# Patient Record
Sex: Female | Born: 1970 | Race: Asian | Hispanic: No | Marital: Married
Health system: Southern US, Community
[De-identification: ages and names within clinical notes are randomized; demographics above are authoritative.]

---

## 2003-05-02 ENCOUNTER — Other Ambulatory Visit: Admission: RE | Admit: 2003-05-02 | Discharge: 2003-05-02 | Payer: Self-pay | Admitting: Family Medicine

## 2003-10-28 ENCOUNTER — Other Ambulatory Visit: Admission: RE | Admit: 2003-10-28 | Discharge: 2003-10-28 | Payer: Self-pay | Admitting: Obstetrics and Gynecology

## 2004-04-12 ENCOUNTER — Inpatient Hospital Stay (HOSPITAL_COMMUNITY): Admission: AD | Admit: 2004-04-12 | Discharge: 2004-04-14 | Payer: Self-pay | Admitting: Obstetrics and Gynecology

## 2005-05-14 ENCOUNTER — Other Ambulatory Visit: Admission: RE | Admit: 2005-05-14 | Discharge: 2005-05-14 | Payer: Self-pay | Admitting: Obstetrics and Gynecology

## 2005-12-28 ENCOUNTER — Inpatient Hospital Stay (HOSPITAL_COMMUNITY): Admission: AD | Admit: 2005-12-28 | Discharge: 2005-12-28 | Payer: Self-pay | Admitting: Obstetrics and Gynecology

## 2007-06-13 IMAGING — US US OB TRANSVAGINAL MODIFY
1 series · 14 of 28 positions shown · non-contrast
Comparison: None.

CLINICAL DATA: Vaginal bleeding at 10 weeks and 0 days of pregnancy by last
menstrual period. No quantitative beta-hCG is available at this time.

COMPLETE OBSTETRICAL ULTRASOUND LESS THAN 14 WEEKS AND TRANSVAGINAL OBSTETRICAL
ULTRASOUND

[Series 1: us ob transvaginal modify · 0.20mm/px · 14 of 51 slices shown]
[im 2/51]
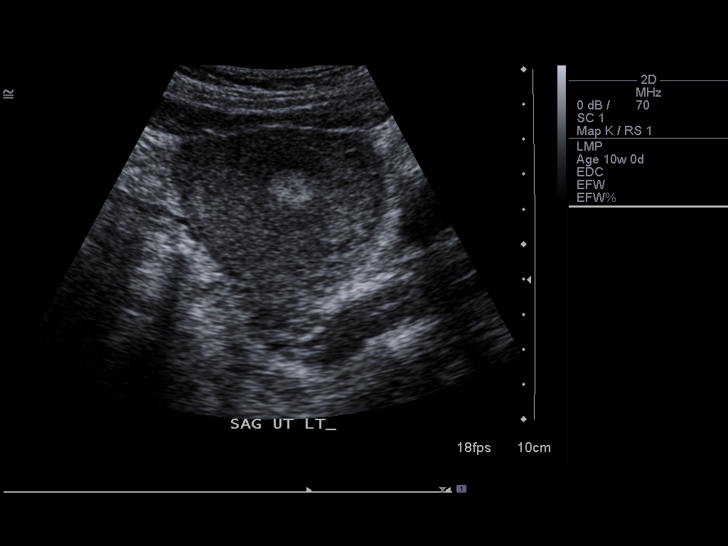
[im 6/51]
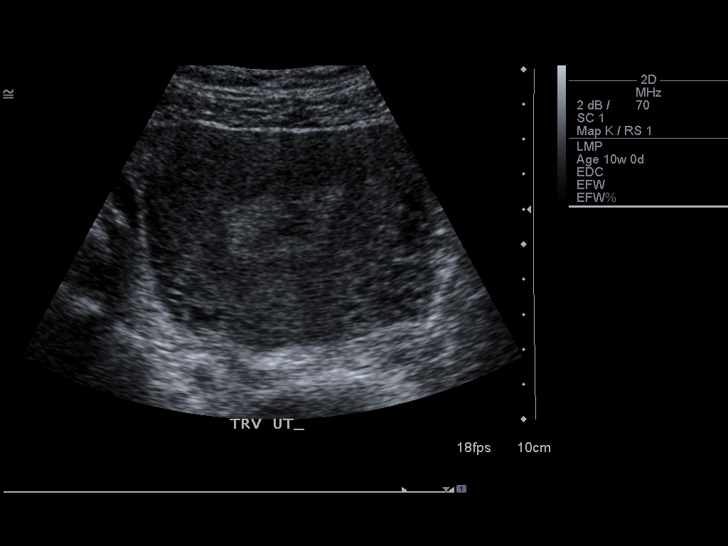
[im 10/51]
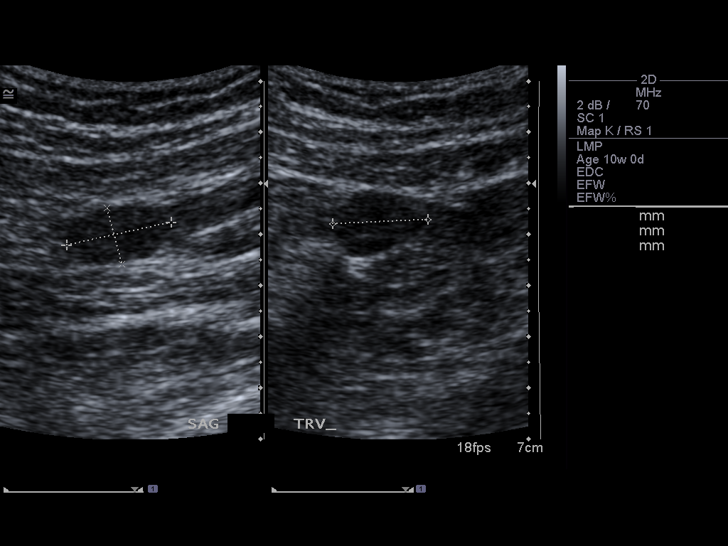
[im 13/51]
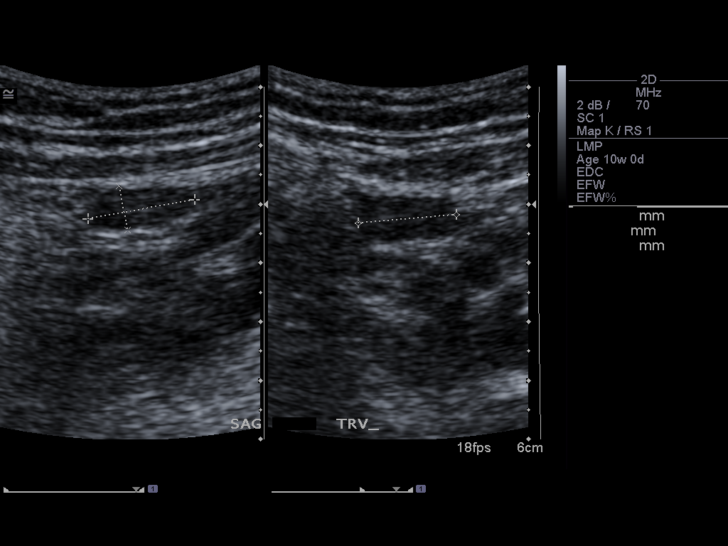
[im 17/51]
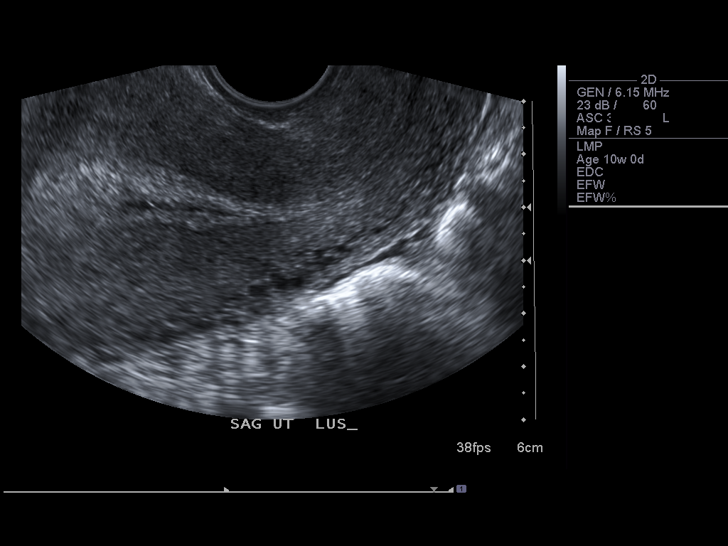
[im 21/51]
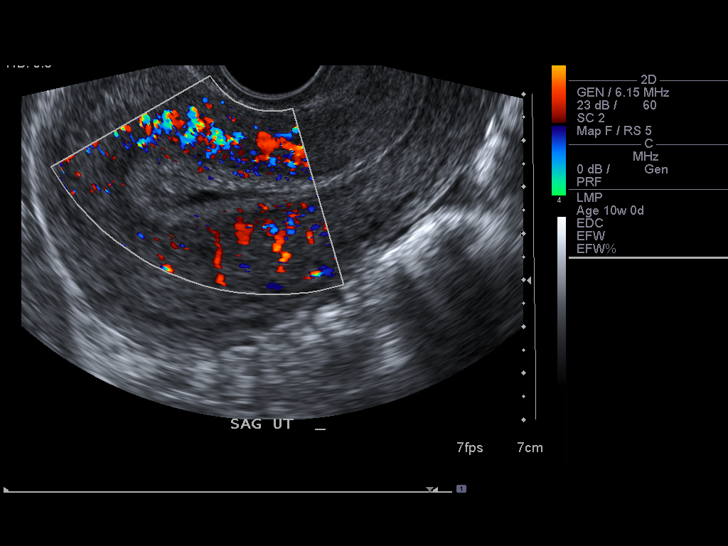
[im 25/51]
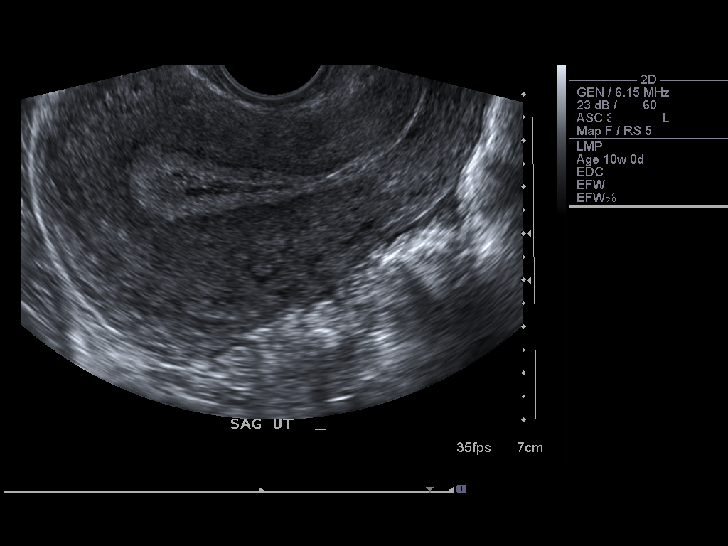
[im 28/51]
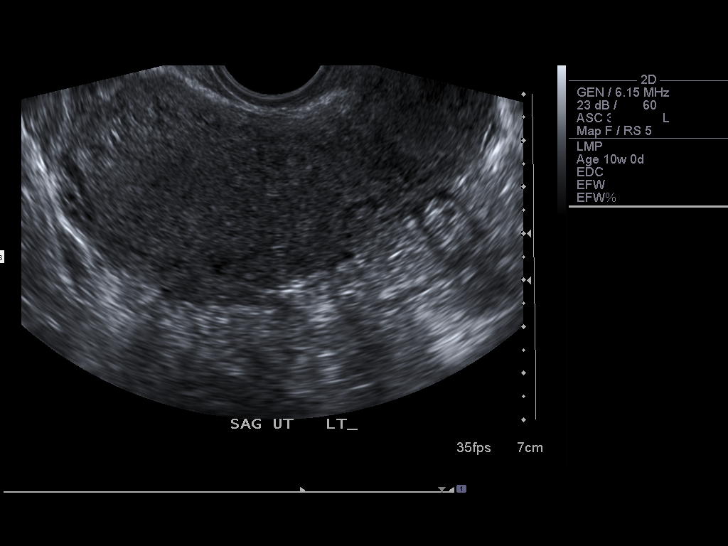
[im 32/51]
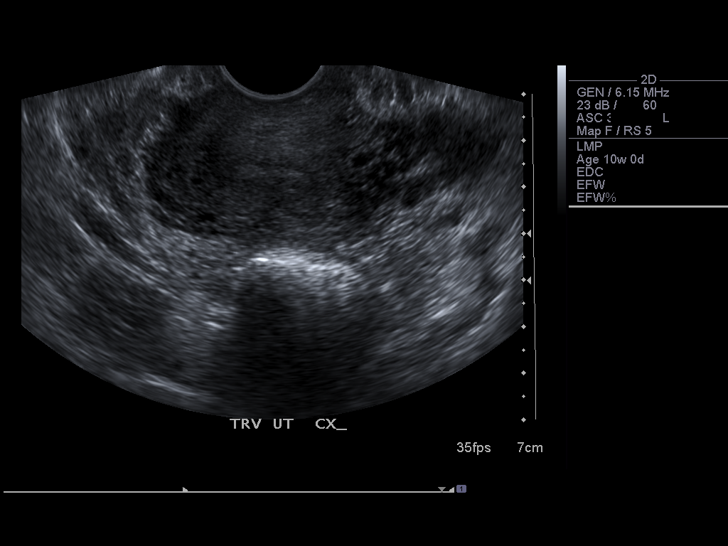
[im 36/51]
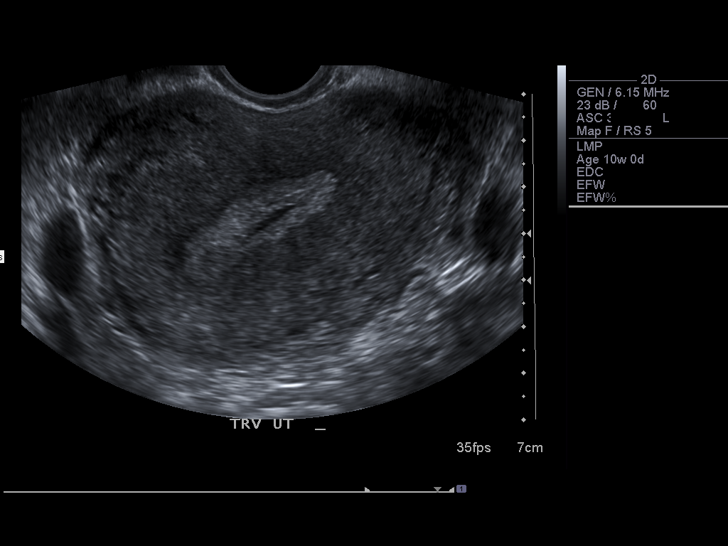
[im 39/51]
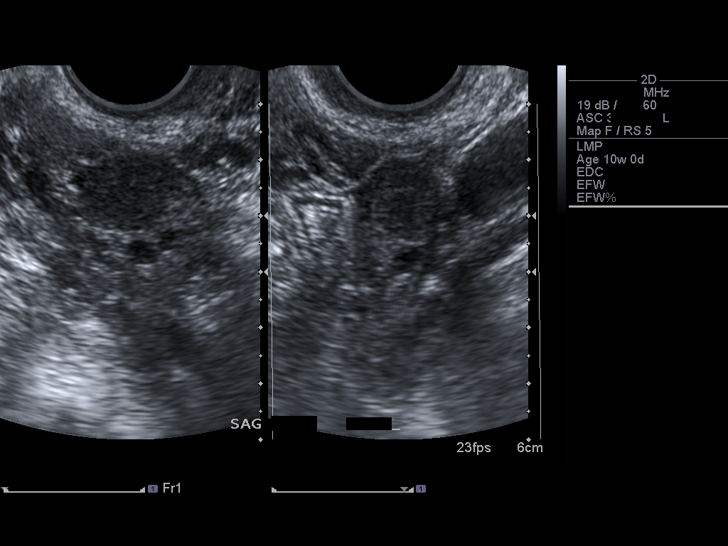
[im 43/51]
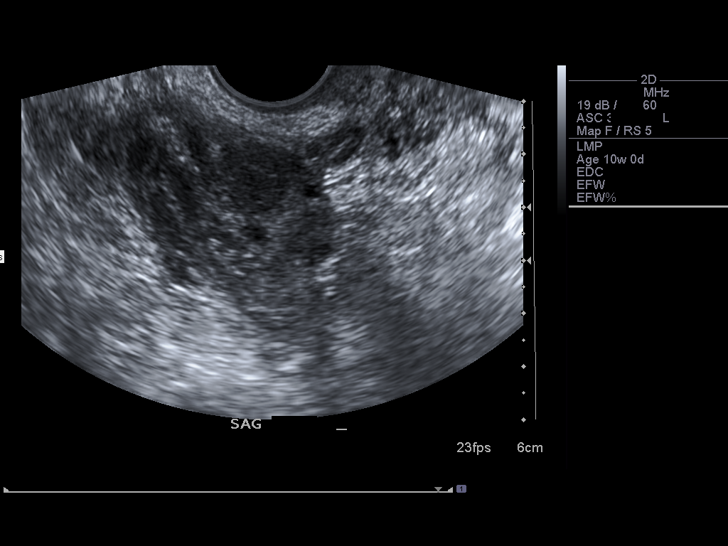
[im 47/51]
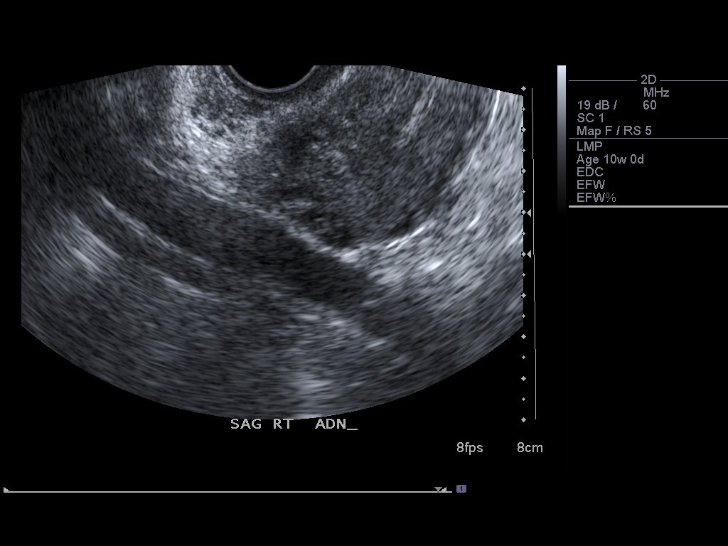
[im 51/51]
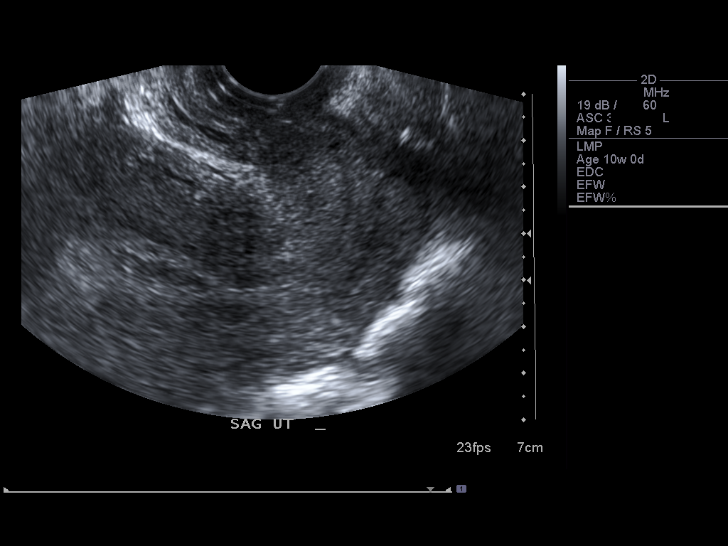

[14 of 28 positions shown; findings below may reference images not displayed]

FINDINGS: Transabdominal and transvaginal sonographic imaging of the pelvis
demonstrates a thickened, heterogeneous endometrial stripe with centrally
decreased echogenicity. This measures 19.7 mm in maximum thickness,
transvaginally. No intrauterine or extrauterine gestational sac seen. 1.5 cm
resolving corpus luteum in the left ovary. Normal appearing right ovary. Trace
amount of free peritoneal fluid, within normal limits physiological fluid.

IMPRESSION

Thickened endometrium with central blood or clot. No gestational sac seen. This
most likely represents spontaneous abortion. An ectopic pregnancy cannot be
excluded. Correlation with serial quantitative beta-hCG is necessary.

## 2018-07-04 ENCOUNTER — Emergency Department (HOSPITAL_COMMUNITY)
Admission: EM | Admit: 2018-07-04 | Discharge: 2018-07-04 | Disposition: A | Discharge status: Home / Self Care | Payer: BLUE CROSS/BLUE SHIELD | Attending: Emergency Medicine | Admitting: Emergency Medicine

## 2018-07-04 ENCOUNTER — Other Ambulatory Visit: Payer: Self-pay

## 2018-07-04 ENCOUNTER — Emergency Department (HOSPITAL_COMMUNITY): Payer: BLUE CROSS/BLUE SHIELD

## 2018-07-04 ENCOUNTER — Encounter (HOSPITAL_COMMUNITY): Payer: Self-pay | Admitting: Emergency Medicine

## 2018-07-04 DIAGNOSIS — R05 Cough: Secondary | ICD-10-CM | POA: Diagnosis present

## 2018-07-04 DIAGNOSIS — U071 COVID-19: Secondary | ICD-10-CM | POA: Diagnosis not present

## 2018-07-04 LAB — BASIC METABOLIC PANEL
Anion gap: 10 (ref 5–15)
BUN: 7 mg/dL (ref 6–20)
CO2: 25 mmol/L (ref 22–32)
Calcium: 8.9 mg/dL (ref 8.9–10.3)
Chloride: 102 mmol/L (ref 98–111)
Creatinine, Ser: 0.68 mg/dL (ref 0.44–1.00)
GFR calc Af Amer: >60 mL/min (ref >60)
GFR calc non Af Amer: >60 mL/min (ref >60)
Glucose, Bld: 109 mg/dL — ABNORMAL HIGH (ref 70–99)
Potassium: 3.1 mmol/L — ABNORMAL LOW (ref 3.5–5.1)
Sodium: 137 mmol/L (ref 135–145)

## 2018-07-04 LAB — CBC
HCT: 38.2 % (ref 36.0–46.0)
Hemoglobin: 12.1 g/dL (ref 12.0–15.0)
MCH: 25.5 pg — ABNORMAL LOW (ref 26.0–34.0)
MCHC: 31.7 g/dL (ref 30.0–36.0)
MCV: 80.4 fL (ref 80.0–100.0)
Platelets: 179 10*3/uL (ref 150–400)
RBC: 4.75 MIL/uL (ref 3.87–5.11)
RDW: 13.6 % (ref 11.5–15.5)
WBC: 4.1 10*3/uL (ref 4.0–10.5)
nRBC: 0 % (ref 0.0–0.2)

## 2018-07-04 LAB — SARS CORONAVIRUS 2 BY RT PCR (HOSPITAL ORDER, PERFORMED IN ~~LOC~~ HOSPITAL LAB): SARS Coronavirus 2: POSITIVE — AB

## 2018-07-04 LAB — LACTIC ACID, PLASMA: Lactic Acid, Venous: 1.1 mmol/L (ref 0.5–1.9)

## 2018-07-04 MED ORDER — SODIUM CHLORIDE 0.9 % IV BOLUS
1000.0000 mL | Freq: Once | INTRAVENOUS | Status: AC
  Administered 2018-07-04: 18:00:00 1000 mL via INTRAVENOUS

## 2018-07-04 MED ORDER — POTASSIUM CHLORIDE CRYS ER 20 MEQ PO TBCR
40.0000 meq | EXTENDED_RELEASE_TABLET | Freq: Once | ORAL | Status: AC
  Administered 2018-07-04: 40 meq via ORAL
  Filled 2018-07-04: qty 2

## 2018-07-04 MED ORDER — ACETAMINOPHEN 325 MG PO TABS
650.0000 mg | ORAL_TABLET | Freq: Once | ORAL | Status: AC
  Administered 2018-07-04: 650 mg via ORAL
  Filled 2018-07-04: qty 2

## 2018-07-04 NOTE — ED Notes (Signed)
Patient verbalizes understanding of discharge instructions. Opportunity for questioning and answers were provided. Armband removed by staff, pt discharged from ED.  

## 2018-07-04 NOTE — ED Triage Notes (Signed)
PT son is covid + at [facility], pt states she has had a cough with fever and chills for several days.

## 2018-07-04 NOTE — ED Provider Notes (Signed)
[facility] Provider Note   CSN: [id number] Arrival date & time: 07/04/18  1635    History   Chief Complaint Chief Complaint  Patient presents with  . Covid +    HPI Heidi Kerr is a 48 y.o. female.     Patient c/o non productive cough and fever for the past several days. Symptoms acute onset, moderate, persistent. +mild sob. No chest pain. Denies sore throat or runny nose. No abd pain. No vomiting or diarrhea. No headache. +family member with recent covid + diagnosis.   The history is provided by the patient.    History reviewed. No pertinent past medical history.  There are no active problems to display for this patient.   History reviewed. No pertinent surgical history.   OB History   No obstetric history on file.      Home Medications    Prior to Admission medications   Medication Sig Start Date End Date Taking? Authorizing Provider  acetaminophen (TYLENOL) 325 MG tablet Take 650 mg by mouth every 6 (six) hours as needed for mild pain or fever.   Yes [provider]  diphenhydrAMINE (BENADRYL) 25 MG tablet Take 25 mg by mouth every 6 (six) hours as needed for itching.   Yes [provider]  ibuprofen (ADVIL) 200 MG tablet Take 400 mg by mouth every 6 (six) hours as needed for fever or mild pain.   Yes [provider]    Family History No family history on file.  Social History Social History   Tobacco Use  . Smoking status: Not on file  Substance Use Topics  . Alcohol use: Not on file  . Drug use: Not on file     Allergies   Patient has no known allergies.   Review of Systems Review of Systems  Constitutional: Positive for fever.  HENT: Negative for sore throat.   Eyes: Negative for redness.  Respiratory: Positive for cough and shortness of breath.   Cardiovascular: Negative for chest pain and leg swelling.  Gastrointestinal: Negative for abdominal pain, diarrhea and  vomiting.  Genitourinary: Negative for flank pain.  Musculoskeletal: Negative for back pain and neck pain.  Skin: Negative for rash.  Neurological: Negative for headaches.  Hematological: Does not bruise/bleed easily.  Psychiatric/Behavioral: Negative for confusion.     Physical Exam Updated Vital Signs BP (!) 98/48 (BP Location: Right Arm)   Pulse 85   Temp 98.7 F (37.1 C) (Oral) Comment: pt just removed gum from mouth  Resp 16   SpO2 99%   Physical Exam Vitals signs and nursing note reviewed.  Constitutional:      Appearance: Normal appearance. She is well-developed.  HENT:     Head: Atraumatic.     Nose: Nose normal.     Mouth/Throat:     Mouth: Mucous membranes are moist.  Eyes:     General: No scleral icterus.    Conjunctiva/sclera: Conjunctivae normal.  Neck:     Musculoskeletal: Normal range of motion and neck supple. No neck rigidity or muscular tenderness.     Trachea: No tracheal deviation.  Cardiovascular:     Rate and Rhythm: Normal rate and regular rhythm.     Pulses: Normal pulses.     Heart sounds: Normal heart sounds. No murmur. No friction rub. No gallop.   Pulmonary:     Effort: Pulmonary effort is normal. No respiratory distress.     Breath sounds: Normal breath sounds.  Abdominal:  General: Bowel sounds are normal. There is no distension.     Palpations: Abdomen is soft.     Tenderness: There is no abdominal tenderness. There is no guarding.  Genitourinary:    Comments: No cva tenderness.  Musculoskeletal:        General: No swelling.     Right lower leg: No edema.     Left lower leg: No edema.  Skin:    General: Skin is warm and dry.     Findings: No rash.  Neurological:     Mental Status: She is alert.     Comments: Alert, speech normal.   Psychiatric:        Mood and Affect: Mood normal.      ED Treatments / Results  Labs (all labs ordered are listed, but only abnormal results are displayed) Results for orders placed or  performed during the hospital encounter of 07/04/18  SARS Coronavirus 2 (CEPHEID- Performed in [facility] hospital lab), [facility] Order  Result Value Ref Range   SARS Coronavirus 2 POSITIVE (A) NEGATIVE  Basic metabolic panel  Result Value Ref Range   Sodium 137 135 - 145 mmol/L   Potassium 3.1 (L) 3.5 - 5.1 mmol/L   Chloride 102 98 - 111 mmol/L   CO2 25 22 - 32 mmol/L   Glucose, Bld 109 (H) 70 - 99 mg/dL   BUN 7 6 - 20 mg/dL   Creatinine, Ser 1.61 0.44 - 1.00 mg/dL   Calcium 8.9 8.9 - 09.6 mg/dL   GFR calc non Af Amer >60 >60 mL/min   GFR calc Af Amer >60 >60 mL/min   Anion gap 10 5 - 15  CBC  Result Value Ref Range   WBC 4.1 4.0 - 10.5 K/uL   RBC 4.75 3.87 - 5.11 MIL/uL   Hemoglobin 12.1 12.0 - 15.0 g/dL   HCT 04.5 40.9 - 81.1 %   MCV 80.4 80.0 - 100.0 fL   MCH 25.5 (L) 26.0 - 34.0 pg   MCHC 31.7 30.0 - 36.0 g/dL   RDW 91.4 78.2 - 95.6 %   Platelets 179 150 - 400 K/uL   nRBC 0.0 0.0 - 0.2 %  Lactic acid, plasma  Result Value Ref Range   Lactic Acid, Venous 1.1 0.5 - 1.9 mmol/L   Dg Chest Port 1 View  Result Date: 07/04/2018 CLINICAL DATA:  Cough and fever. EXAM: PORTABLE CHEST 1 VIEW COMPARISON:  None. FINDINGS: The heart size and mediastinal contours are within normal limits. Both lungs are clear. The visualized skeletal structures are unremarkable. IMPRESSION: No active disease. Electronically Signed   By: Gerome Sam III M.D   On: 07/04/2018 17:31    EKG None  Radiology Dg Chest Port 1 View  Result Date: 07/04/2018 CLINICAL DATA:  Cough and fever. EXAM: PORTABLE CHEST 1 VIEW COMPARISON:  None. FINDINGS: The heart size and mediastinal contours are within normal limits. Both lungs are clear. The visualized skeletal structures are unremarkable. IMPRESSION: No active disease. Electronically Signed   By: Gerome Sam III M.D   On: 07/04/2018 17:31    Procedures Procedures (including critical care time)  Medications Ordered in ED Medications - No data to display    Initial Impression / Assessment and Plan / ED Course  I have reviewed the triage vital signs and the nursing notes.  Pertinent labs & imaging results that were available during my care of the patient were reviewed by me and considered in my medical decision making (see  chart for details).  Labs sent.  Reviewed nursing notes and prior charts for additional history.   Labs reviewed by me - k sl low. kcl po. Po fluids.  cxr reviewed by me - no pna.  Heidi Kerr was evaluated in Emergency Department on 07/04/2018 for the symptoms described in the history of present illness. She was evaluated in the context of the global COVID-19 pandemic, which necessitated consideration that the patient might be at risk for infection with the SARS-CoV-2 virus that causes COVID-19. Institutional protocols and algorithms that pertain to the evaluation of patients at risk for COVID-19 are in a state of rapid change based on information released by regulatory bodies including the CDC and federal and state organizations. These policies and algorithms were followed during the patient's care in the ED.  covid test is positive. Discussed w pt.   Iv ns bolus. Po fluids.  Recheck 2015, bp 107/77, hr 78, rr 16, pulse ox 98% room air. No increased wob.   Discussed quarantine and return precautions.   Pt currently appears staboe for d/c.     Final Clinical Impressions(s) / ED Diagnoses   Final diagnoses:  None    ED Discharge Orders    None       Cathren Laine, MD 07/04/18 2017

## 2018-07-04 NOTE — Discharge Instructions (Addendum)
It was our pleasure to provide your ER care today - we hope that you feel better.  Your covid test is positive - quarantine at home for atleast 3 days after symptoms, fever, and cough have resolved.   Return to ER if worse, new symptoms, increased trouble breathing, other concern.

## 2018-07-06 ENCOUNTER — Other Ambulatory Visit: Payer: Self-pay | Admitting: Internal Medicine

## 2018-07-06 DIAGNOSIS — E876 Hypokalemia: Secondary | ICD-10-CM | POA: Insufficient documentation

## 2018-07-06 DIAGNOSIS — U071 COVID-19: Secondary | ICD-10-CM | POA: Insufficient documentation

## 2018-07-06 DIAGNOSIS — Z862 Personal history of diseases of the blood and blood-forming organs and certain disorders involving the immune mechanism: Secondary | ICD-10-CM

## 2018-07-06 DIAGNOSIS — J40 Bronchitis, not specified as acute or chronic: Secondary | ICD-10-CM

## 2018-07-06 MED ORDER — BENZONATATE 100 MG PO CAPS: 200.0000 mg | ORAL_CAPSULE | Freq: Three times a day (TID) | ORAL | 0 refills | Status: DC

## 2018-07-06 MED ORDER — AZITHROMYCIN 500 MG PO TABS: 500.0000 mg | ORAL_TABLET | Freq: Every day | ORAL | 0 refills | Status: AC

## 2018-07-06 MED ORDER — AZITHROMYCIN 500 MG PO TABS: 500.0000 mg | ORAL_TABLET | Freq: Every day | ORAL | 0 refills | Status: DC

## 2018-07-06 MED ORDER — BENZONATATE 100 MG PO CAPS: 200.0000 mg | ORAL_CAPSULE | Freq: Three times a day (TID) | ORAL | 0 refills | Status: AC

## 2018-07-06 NOTE — Progress Notes (Signed)
Patient dx with Covid-19. Worsening cough and symptoms at home. No PCP. Her son is hospitalized at [facility] with covid. She was seen in ED 2 days ago.  Tessalon and Azithromycin sent to [facility] at [facility]. She understands instructions.  Anderson Malta, DO Palliative Medicine

## 2018-07-08 ENCOUNTER — Other Ambulatory Visit: Payer: Self-pay | Admitting: Internal Medicine

## 2018-07-08 MED ORDER — MONTELUKAST SODIUM 10 MG PO TABS: 10.0000 mg | ORAL_TABLET | Freq: Every day | ORAL | 0 refills | Status: AC

## 2018-07-08 NOTE — Progress Notes (Signed)
Continued coughing and respiratory Covid symptoms. Will start trial of Singulair-known hx of seasonal allergies.  Anderson Malta, DO Palliative Medicine

## 2018-07-09 LAB — CULTURE, BLOOD (ROUTINE X 2)
Culture: NO GROWTH
Culture: NO GROWTH

## 2019-08-03 ENCOUNTER — Ambulatory Visit: Payer: BC Managed Care – PPO | Attending: Internal Medicine

## 2019-08-03 DIAGNOSIS — Z23 Encounter for immunization: Secondary | ICD-10-CM

## 2019-08-03 NOTE — Progress Notes (Signed)
   Covid-19 Vaccination Clinic  Name:  Heidi Kerr    MRN: [id number] DOB: 07/24/1970  08/03/2019  Heidi Kerr was observed post Covid-19 immunization for 15 minutes without incident. She was provided with Vaccine Information Sheet and instruction to access the V-Safe system.   Heidi Kerr was instructed to call 911 with any severe reactions post vaccine: Marland Kitchen Difficulty breathing  . Swelling of face and throat  . A fast heartbeat  . A bad rash all over body  . Dizziness and weakness   Immunizations Administered    Name Date Dose VIS Date Route   Pfizer COVID-19 Vaccine 08/03/2019 11:34 AM 0.3 mL 03/31/2018 Intramuscular   Manufacturer: [employer], [employer]   Lot: [id number]   NDC: [address]-8

## 2019-08-31 ENCOUNTER — Ambulatory Visit: Payer: BC Managed Care – PPO | Attending: Critical Care Medicine

## 2019-08-31 ENCOUNTER — Ambulatory Visit: Payer: BLUE CROSS/BLUE SHIELD

## 2019-08-31 DIAGNOSIS — Z23 Encounter for immunization: Secondary | ICD-10-CM

## 2019-08-31 NOTE — Progress Notes (Signed)
   Covid-19 Vaccination Clinic  Name:  Ori Kreiter    MRN: [id number] DOB: 02/24/1970  08/31/2019  Ms. Cantin was observed post Covid-19 immunization for 15 minutes without incident. She was provided with Vaccine Information Sheet and instruction to access the V-Safe system.   Ms. Suchan was instructed to call 911 with any severe reactions post vaccine: Marland Kitchen Difficulty breathing  . Swelling of face and throat  . A fast heartbeat  . A bad rash all over body  . Dizziness and weakness   Immunizations Administered    Name Date Dose VIS Date Route   Pfizer COVID-19 Vaccine 08/31/2019  9:23 AM 0.3 mL 03/31/2018 Intramuscular   Manufacturer: [employer], [employer]   Lot: [id number]   NDC: [address]-4

## 2019-12-18 IMAGING — CR PORTABLE CHEST - 1 VIEW
1 series · 1 of 1 positions shown · non-contrast
Comparison: None.

CLINICAL DATA: Cough and fever.

EXAM:
PORTABLE CHEST 1 VIEW

[AP]
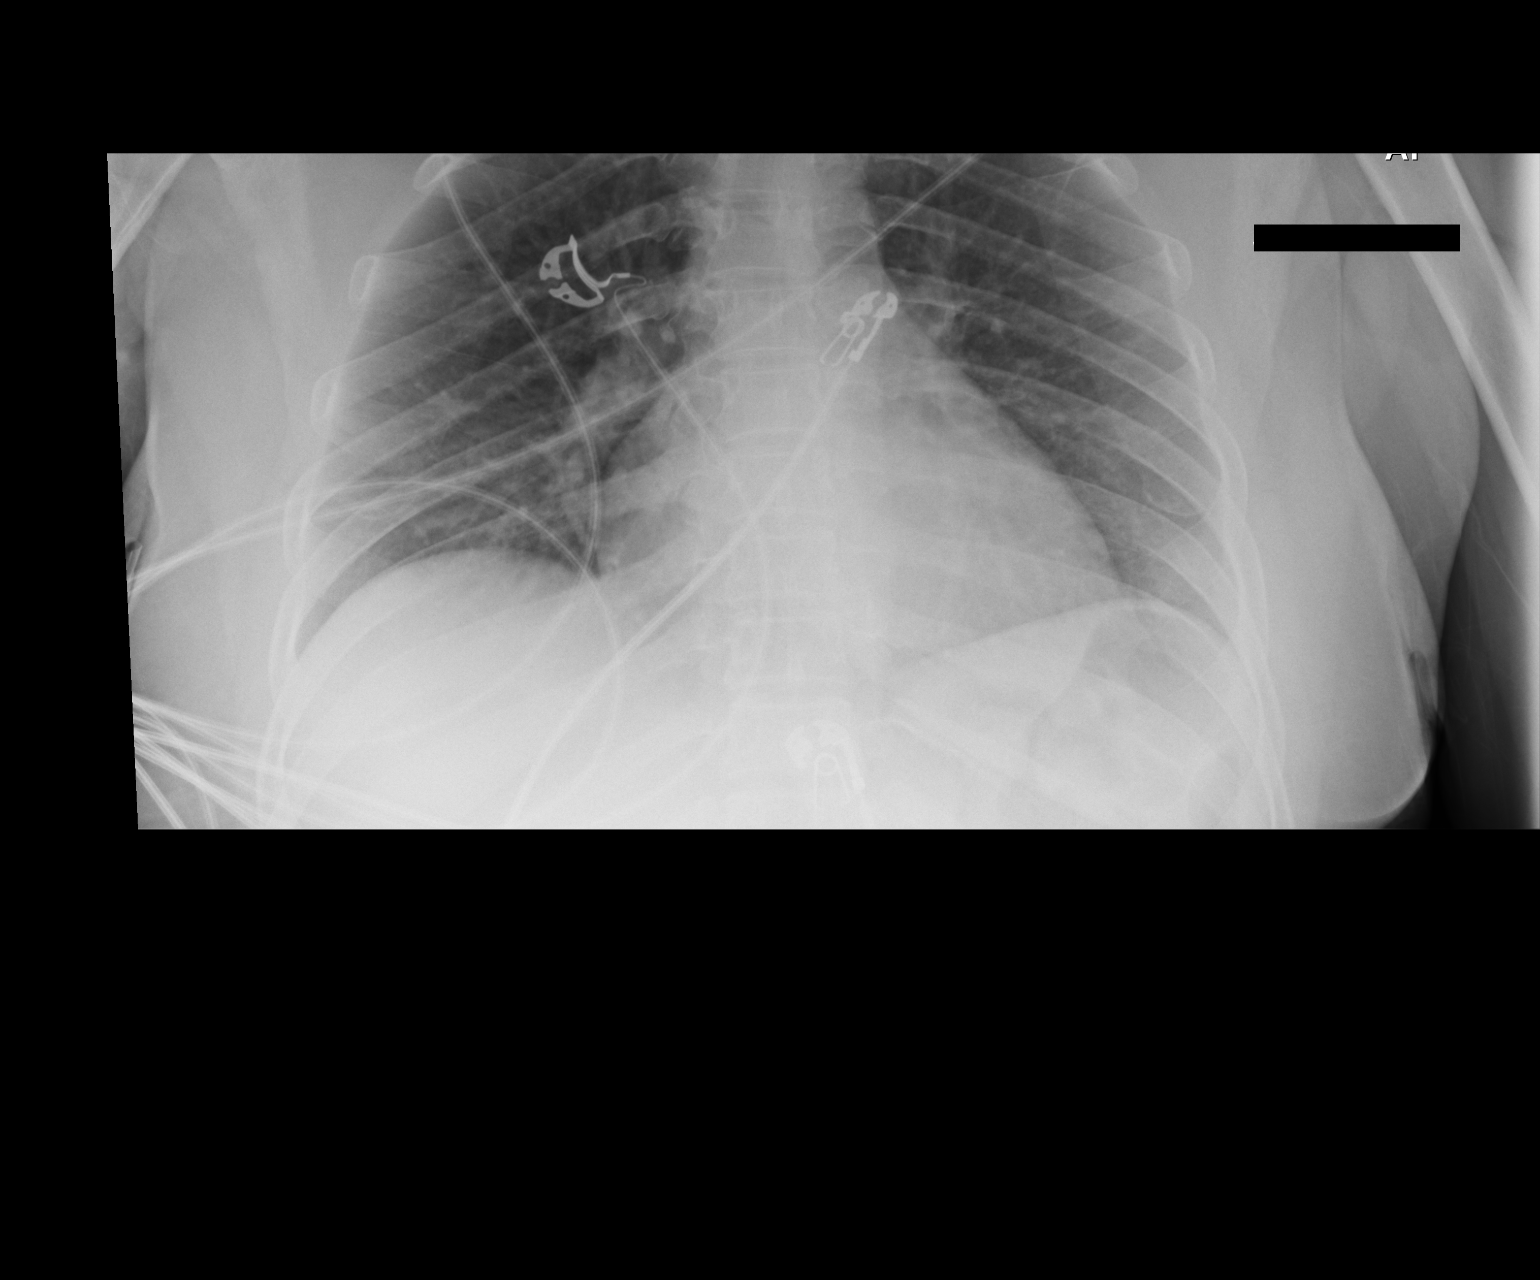

[1 of 1 positions shown; findings below may reference images not displayed]

FINDINGS: The heart size and mediastinal contours are within normal limits.
Both lungs are clear. The visualized skeletal structures are
unremarkable.
IMPRESSION: No active disease.
